# Patient Record
Sex: Male | Born: 1980 | Hispanic: Yes | Smoking: Never smoker
Health system: Southern US, Community
[De-identification: ages and names within clinical notes are randomized; demographics above are authoritative.]

---

## 2013-07-25 ENCOUNTER — Emergency Department: Payer: Self-pay | Admitting: Emergency Medicine

## 2019-02-15 ENCOUNTER — Emergency Department
Admission: EM | Admit: 2019-02-15 | Discharge: 2019-02-15 | Disposition: A | Discharge status: Home / Self Care | Payer: Self-pay | Attending: Student in an Organized Health Care Education/Training Program | Admitting: Student in an Organized Health Care Education/Training Program

## 2019-02-15 ENCOUNTER — Emergency Department: Payer: Self-pay

## 2019-02-15 ENCOUNTER — Other Ambulatory Visit: Payer: Self-pay

## 2019-02-15 DIAGNOSIS — Y929 Unspecified place or not applicable: Secondary | ICD-10-CM | POA: Insufficient documentation

## 2019-02-15 DIAGNOSIS — Y939 Activity, unspecified: Secondary | ICD-10-CM | POA: Insufficient documentation

## 2019-02-15 DIAGNOSIS — S52021A Displaced fracture of olecranon process without intraarticular extension of right ulna, initial encounter for closed fracture: Secondary | ICD-10-CM | POA: Insufficient documentation

## 2019-02-15 DIAGNOSIS — Y999 Unspecified external cause status: Secondary | ICD-10-CM | POA: Insufficient documentation

## 2019-02-15 DIAGNOSIS — W11XXXA Fall on and from ladder, initial encounter: Secondary | ICD-10-CM | POA: Insufficient documentation

## 2019-02-15 DIAGNOSIS — S0990XA Unspecified injury of head, initial encounter: Secondary | ICD-10-CM | POA: Insufficient documentation

## 2019-02-15 LAB — CBC WITH DIFFERENTIAL/PLATELET
Abs Immature Granulocytes: 0.03 10*3/uL (ref 0.00–0.07)
Basophils Absolute: 0.1 10*3/uL (ref 0.0–0.1)
Basophils Relative: 0 %
Eosinophils Absolute: 0.2 10*3/uL (ref 0.0–0.5)
Eosinophils Relative: 2 %
HCT: 42 % (ref 39.0–52.0)
Hemoglobin: 14.2 g/dL (ref 13.0–17.0)
Immature Granulocytes: 0 %
Lymphocytes Relative: 13 %
Lymphs Abs: 1.4 10*3/uL (ref 0.7–4.0)
MCH: 31.1 pg (ref 26.0–34.0)
MCHC: 33.8 g/dL (ref 30.0–36.0)
MCV: 92.1 fL (ref 80.0–100.0)
Monocytes Absolute: 0.6 10*3/uL (ref 0.1–1.0)
Monocytes Relative: 6 %
Neutro Abs: 8.9 10*3/uL — ABNORMAL HIGH (ref 1.7–7.7)
Neutrophils Relative %: 79 %
Platelets: 171 10*3/uL (ref 150–400)
RBC: 4.56 MIL/uL (ref 4.22–5.81)
RDW: 12.3 % (ref 11.5–15.5)
WBC: 11.3 10*3/uL — ABNORMAL HIGH (ref 4.0–10.5)
nRBC: 0 % (ref 0.0–0.2)

## 2019-02-15 LAB — BASIC METABOLIC PANEL
Anion gap: 11 (ref 5–15)
BUN: 25 mg/dL — ABNORMAL HIGH (ref 6–20)
CO2: 25 mmol/L (ref 22–32)
Calcium: 9 mg/dL (ref 8.9–10.3)
Chloride: 101 mmol/L (ref 98–111)
Creatinine, Ser: 1.04 mg/dL (ref 0.61–1.24)
GFR calc Af Amer: >60 mL/min (ref >60)
GFR calc non Af Amer: >60 mL/min (ref >60)
Glucose, Bld: 168 mg/dL — ABNORMAL HIGH (ref 70–99)
Potassium: 3.5 mmol/L (ref 3.5–5.1)
Sodium: 137 mmol/L (ref 135–145)

## 2019-02-15 MED ORDER — HYDROCODONE-ACETAMINOPHEN 5-325 MG PO TABS
1.0000 | ORAL_TABLET | Freq: Once | ORAL | Status: AC
  Administered 2019-02-15: 1 via ORAL
  Filled 2019-02-15: qty 1

## 2019-02-15 MED ORDER — MORPHINE SULFATE (PF) 4 MG/ML IV SOLN
4.0000 mg | INTRAVENOUS | Status: DC | PRN
  Administered 2019-02-15 (×2): 4 mg via INTRAVENOUS
  Filled 2019-02-15 (×2): qty 1

## 2019-02-15 MED ORDER — HYDROCODONE-ACETAMINOPHEN 5-325 MG PO TABS: 1.0000 | ORAL_TABLET | ORAL | 0 refills | Status: AC | PRN

## 2019-02-15 MED ORDER — ONDANSETRON HCL 4 MG/2ML IJ SOLN
4.0000 mg | Freq: Once | INTRAMUSCULAR | Status: AC
  Administered 2019-02-15: 4 mg via INTRAVENOUS
  Filled 2019-02-15: qty 2

## 2019-02-15 NOTE — ED Triage Notes (Signed)
Interpreter used during triage. States fell about 18 feet off ladder. C/o of R elbow pain and states hit R forehead but denies LOC. States ladder wasn't steady and he fell. Talking in complete sentences. Has a sling on R arm.

## 2019-02-15 NOTE — ED Notes (Signed)
Patient transported to CT 

## 2019-02-15 NOTE — ED Notes (Signed)
Sling immobilizer placed

## 2019-02-15 NOTE — ED Notes (Signed)
Ice applied to R elbow.

## 2019-02-15 NOTE — ED Provider Notes (Signed)
[facility] Emergency Department Provider Note    First MD Initiated Contact with Patient 02/15/19 1515     (approximate)  I have reviewed the triage vital signs and the nursing notes.   HISTORY  Chief Complaint Fall    HPI Steven Huff is a 38 y.o. male since the ER roughly 30 minutes after having mechanical fall roughly 18 feet off of a ladder.  Denies a syncopal episode states that the bladder was wobbly and he fell onto his right elbow did hit his head without loss of consciousness.  Does have mild low neck pain as well but denies any numbness or tingling.  No abdominal pain.  No chest pain or shortness of breath.  He is not on any blood thinners.  No previous past medical history.    History reviewed. No pertinent past medical history. History reviewed. No pertinent family history. History reviewed. No pertinent surgical history. There are no active problems to display for this patient.     Prior to Admission medications   Not on File    Allergies Patient has no known allergies.    Social History Social History   Tobacco Use  . Smoking status: Never Smoker  Substance Use Topics  . Alcohol use: Yes  . Drug use: Not on file    Review of Systems Patient denies headaches, rhinorrhea, blurry vision, numbness, shortness of breath, chest pain, edema, cough, abdominal pain, nausea, vomiting, diarrhea, dysuria, fevers, rashes or hallucinations unless otherwise stated above in HPI. ____________________________________________   PHYSICAL EXAM:  VITAL SIGNS: Vitals:   02/15/19 1830 02/15/19 1900  BP: 131/79 (!) 137/94  Pulse:    Resp:    Temp:    SpO2: 99%     Constitutional: Alert and oriented.  Eyes: Conjunctivae are normal.  Head: Atraumatic. Nose: No congestion/rhinnorhea. Mouth/Throat: Mucous membranes are moist.   Neck: No stridor. Painless ROM.  Cardiovascular: Normal rate, regular rhythm. Grossly normal heart  sounds.  Good peripheral circulation. Respiratory: Normal respiratory effort.  No retractions. Lungs CTAB. Gastrointestinal: Soft and nontender. No distention. No abdominal bruits. No CVA tenderness. Musculoskeletal: Severe tenderness to palpation with effusion and swelling to the right elbow.  No shoulder pain.  Neurovascular intact distally.  No lower extremity tenderness nor edema.  No joint effusions. Neurologic:  Normal speech and language. No gross focal neurologic deficits are appreciated. No facial droop Skin:  Skin is warm, dry and intact. No rash noted. Psychiatric: Mood and affect are normal. Speech and behavior are normal.  ____________________________________________   LABS (all labs ordered are listed, but only abnormal results are displayed)  Results for orders placed or performed during the hospital encounter of 02/15/19 (from the past 24 hour(s))  CBC with Differential/Platelet     Status: Abnormal   Collection Time: 02/15/19  3:28 PM  Result Value Ref Range   WBC 11.3 (H) 4.0 - 10.5 K/uL   RBC 4.56 4.22 - 5.81 MIL/uL   Hemoglobin 14.2 13.0 - 17.0 g/dL   HCT 16.1 09.6 - 04.5 %   MCV 92.1 80.0 - 100.0 fL   MCH 31.1 26.0 - 34.0 pg   MCHC 33.8 30.0 - 36.0 g/dL   RDW 40.9 81.1 - 91.4 %   Platelets 171 150 - 400 K/uL   nRBC 0.0 0.0 - 0.2 %   Neutrophils Relative % 79 %   Neutro Abs 8.9 (H) 1.7 - 7.7 K/uL   Lymphocytes Relative 13 %   Lymphs Abs  1.4 0.7 - 4.0 K/uL   Monocytes Relative 6 %   Monocytes Absolute 0.6 0.1 - 1.0 K/uL   Eosinophils Relative 2 %   Eosinophils Absolute 0.2 0.0 - 0.5 K/uL   Basophils Relative 0 %   Basophils Absolute 0.1 0.0 - 0.1 K/uL   Immature Granulocytes 0 %   Abs Immature Granulocytes 0.03 0.00 - 0.07 K/uL  Basic metabolic panel     Status: Abnormal   Collection Time: 02/15/19  3:28 PM  Result Value Ref Range   Sodium 137 135 - 145 mmol/L   Potassium 3.5 3.5 - 5.1 mmol/L   Chloride 101 98 - 111 mmol/L   CO2 25 22 - 32 mmol/L    Glucose, Bld 168 (H) 70 - 99 mg/dL   BUN 25 (H) 6 - 20 mg/dL   Creatinine, Ser 0.01 0.61 - 1.24 mg/dL   Calcium 9.0 8.9 - 74.9 mg/dL   GFR calc non Af Amer >60 >60 mL/min   GFR calc Af Amer >60 >60 mL/min   Anion gap 11 5 - 15   ____________________________________________ ____________________________________________  RADIOLOGY  I personally reviewed all radiographic images ordered to evaluate for the above acute complaints and reviewed radiology reports and findings.  These findings were personally discussed with the patient.  Please see medical record for radiology report.  ____________________________________________   PROCEDURES  Procedure(s) performed:  Procedures    Critical Care performed: no ____________________________________________   INITIAL IMPRESSION / ASSESSMENT AND PLAN / ED COURSE  Pertinent labs & imaging results that were available during my care of the patient were reviewed by me and considered in my medical decision making (see chart for details).   DDX: sah, sdh, edh, fracture, contusion, soft tissue injury, viscous injury, concussion, hemorrhage   Steven Huff is a 38 y.o. who presents to the ED with acute right elbow pain as described above after fall from ladder.  CT imaging ordered for injury to head and neck fortunately is reassuring.  Will order x-ray of elbow and shoulder.  Abdominal exam soft and benign.  No pain of hip or legs.  Neurovascularly with reassuring exam.  Compartments are soft.  Clinical Course as of Feb 14 1914  Sat Feb 15, 2019  1625 Patient still in significant pain.  On repeat exam after reviewing x-ray which does not show any evidence of obvious fracture.  Compartments are soft.  Does have some swelling.  Possible dislocation.  Will give additional pain medication.   [PR]  1655 Radiology report shows evidence of avulsion fracture.  Patient does still have intact extensor mechanism though weak secondary to pain.  Repeat  exam with soft compartments.  Neurovascular intact distally.   [PR]  1707 Discussed case with Dr. Rosita Kea of orthopedics in consultation.  Given the mechanism of injury has recommended CT imaging which will be obtained.   [PR]  1856 Discussed case with menz who agrees with plan for splint and close outpatient follow up.     [PR]    Clinical Course User Index [PR] Willy Eddy, MD     As part of my medical decision making, I reviewed the following data within the electronic MEDICAL RECORD NUMBER Nursing notes reviewed and incorporated, Labs reviewed, notes from prior ED visits and [place] Controlled Substance Database   ____________________________________________   FINAL CLINICAL IMPRESSION(S) / ED DIAGNOSES  Final diagnoses:  Olecranon fracture, right, closed, initial encounter      NEW MEDICATIONS STARTED DURING THIS VISIT:  New Prescriptions  No medications on file     Note:  This document was prepared using Dragon voice recognition software and may include unintentional dictation errors.    Aquil Duhe, PaWilly Eddytrick, MD 02/15/19 [phone]

## 2020-07-07 IMAGING — CR CHEST - 2 VIEW
2 series · 2 of 2 positions shown · non-contrast
Comparison: None.

CLINICAL DATA: 37-year-old who fell from a ladder earlier today and
injured the RIGHT elbow.

EXAM:
CHEST - 2 VIEW

[chest lat]
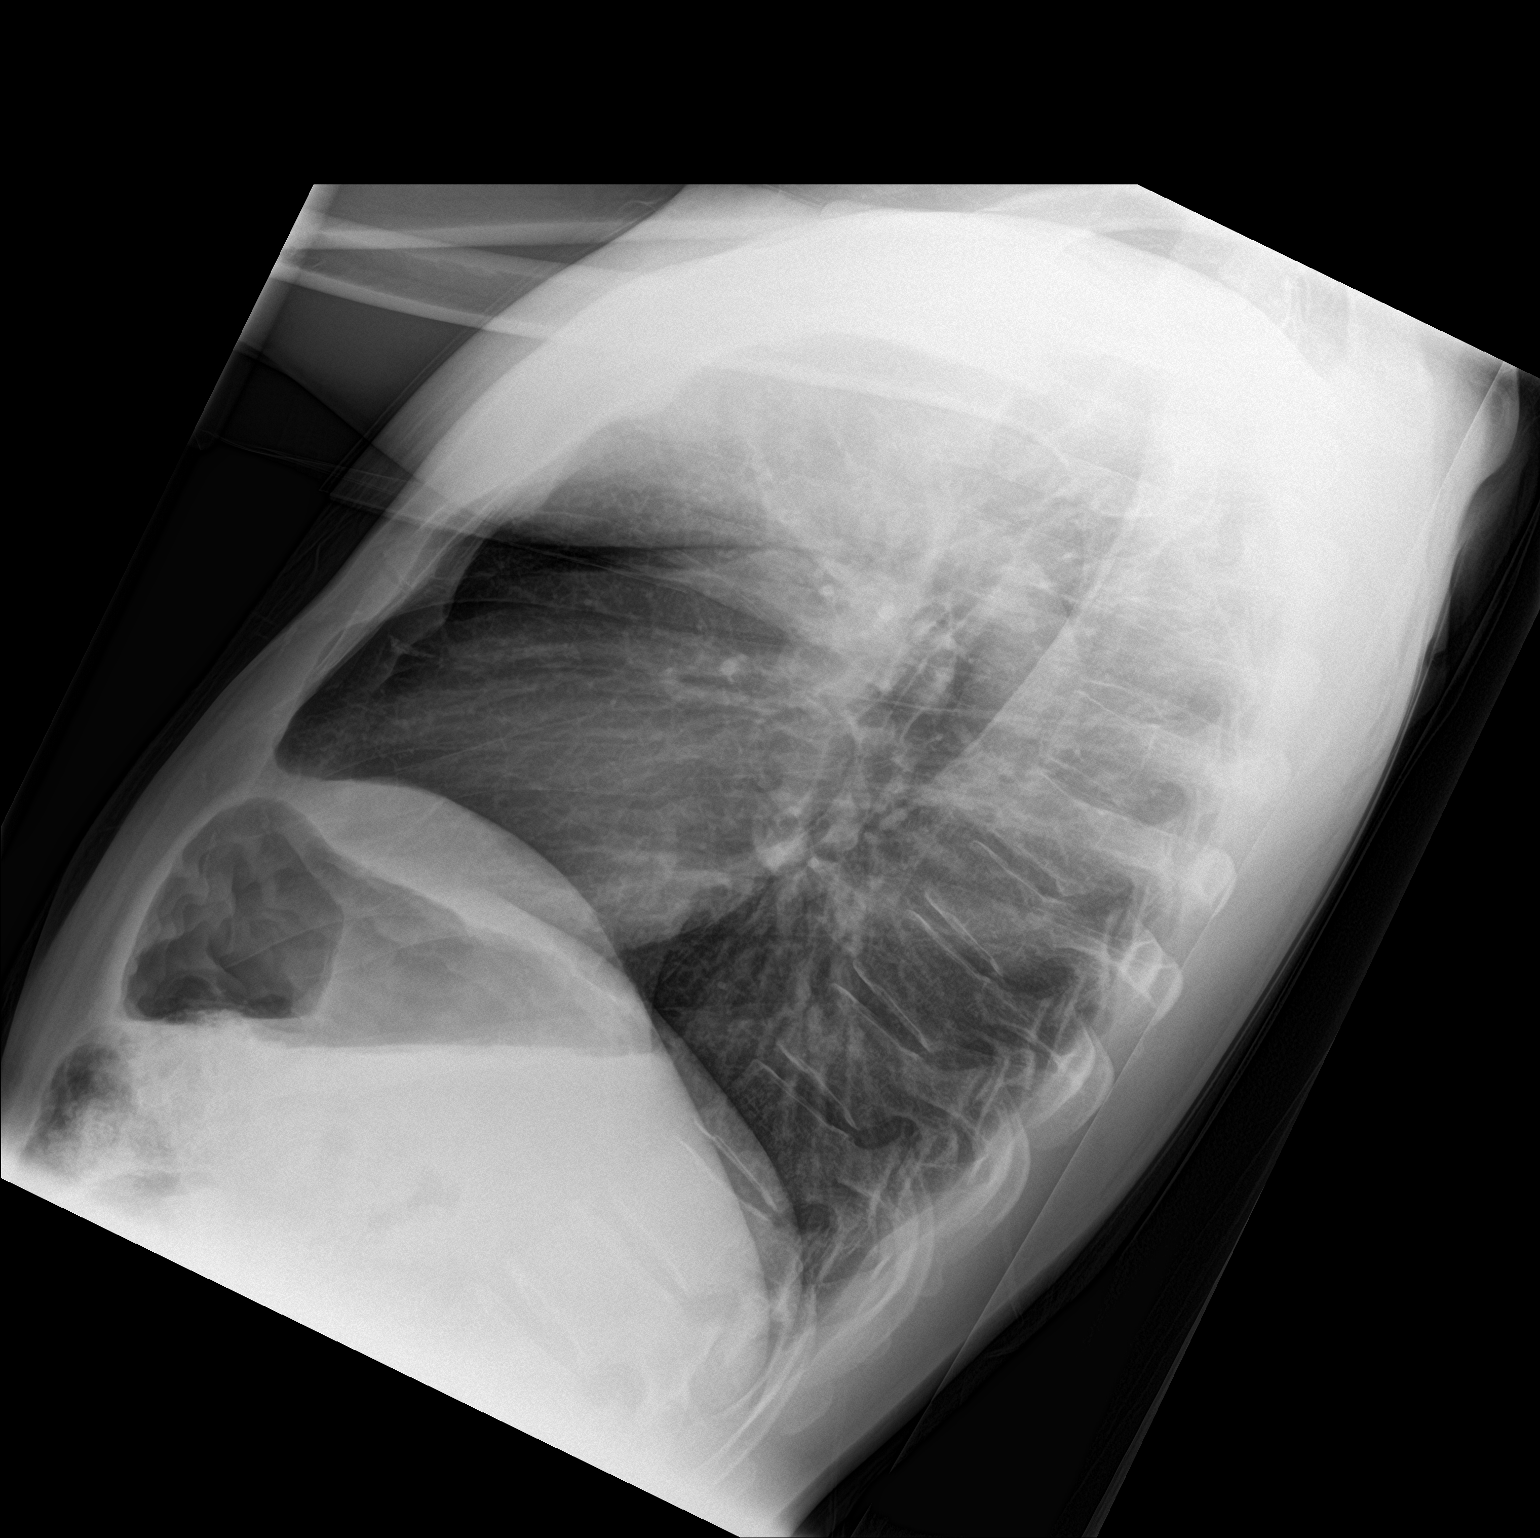

[chest ap]
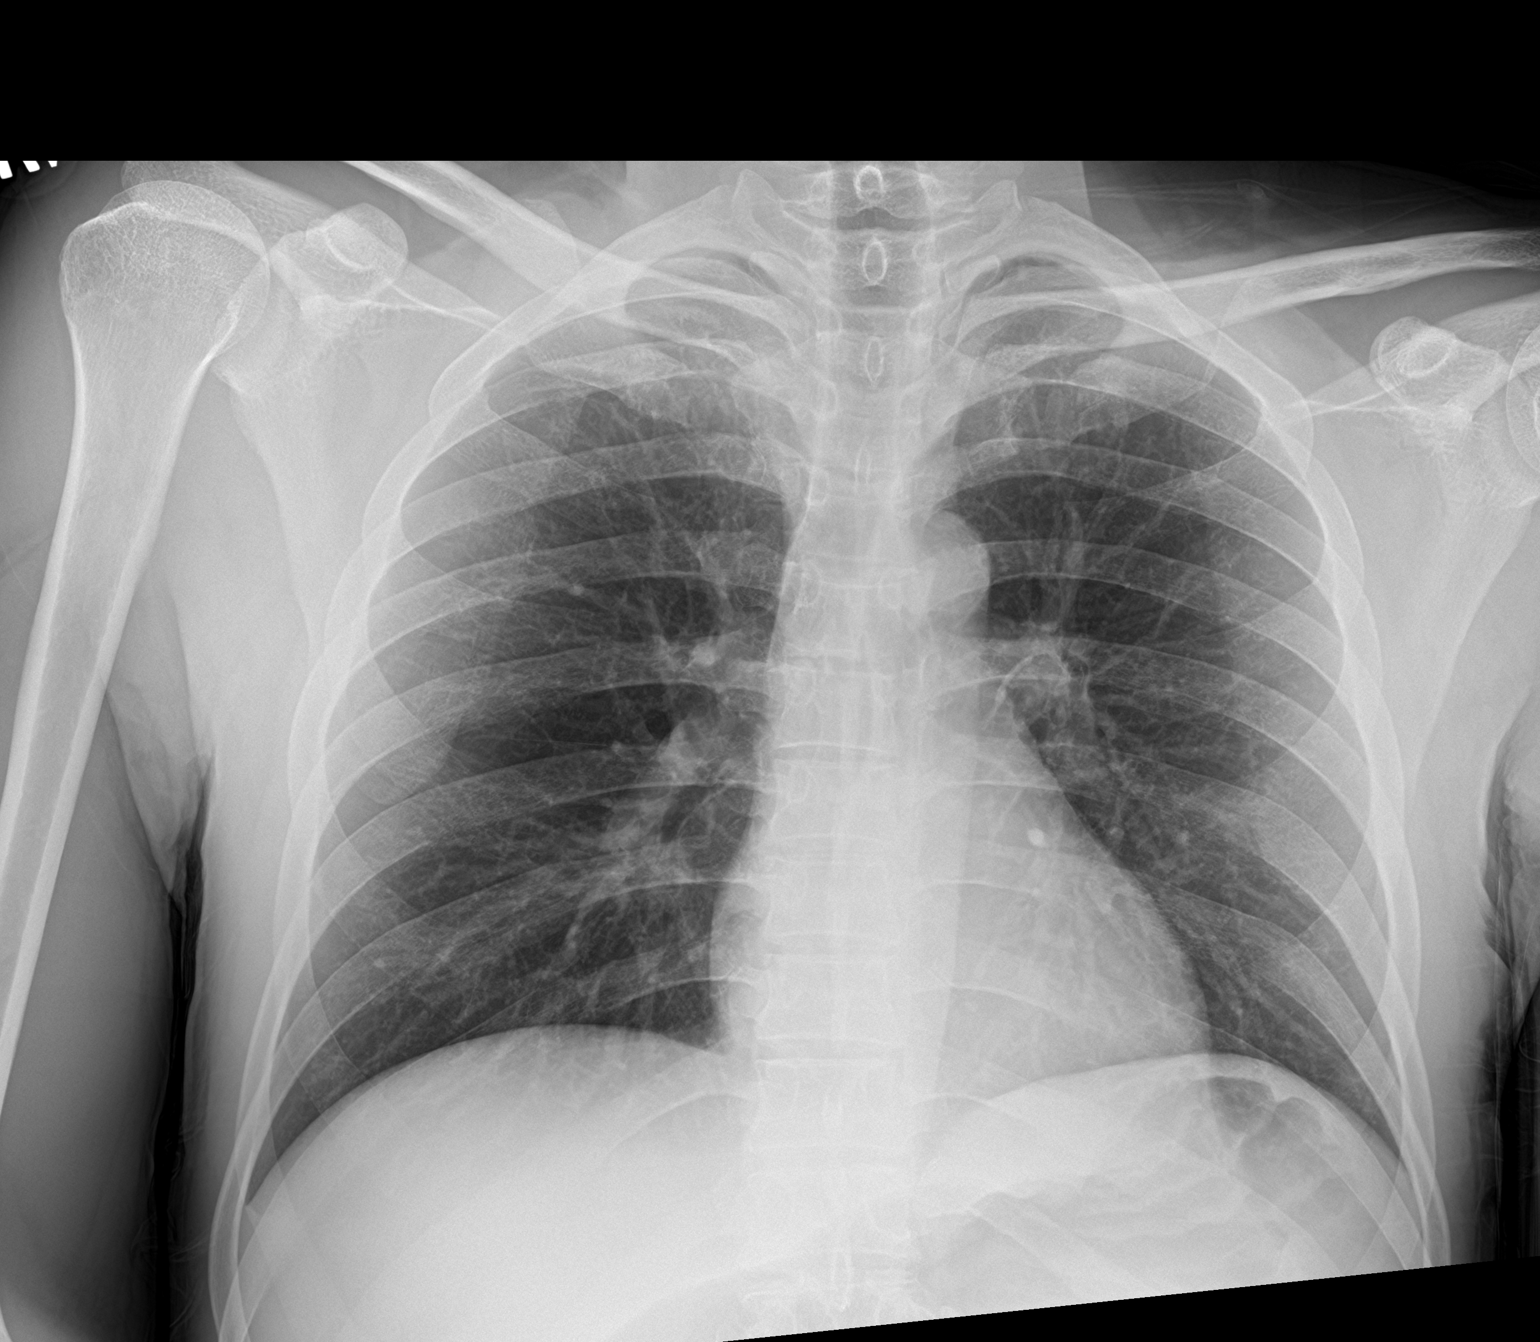

[2 of 2 positions shown; findings below may reference images not displayed]

FINDINGS: AP ERECT and LATERAL images were obtained. Cardiomediastinal
silhouette unremarkable. Lungs clear. Bronchovascular markings
normal. Pulmonary vascularity normal. No pneumothorax. No pleural
effusions. Visualized bony thorax intact.
IMPRESSION: Normal examination.
